# Patient Record
Sex: Male | Born: 1975 | Race: White | Hispanic: No | Marital: Married | Smoking: Never smoker
Health system: Southern US, Community
[De-identification: ages and names within clinical notes are randomized; demographics above are authoritative.]

---

## 1998-10-20 ENCOUNTER — Emergency Department (HOSPITAL_COMMUNITY): Admission: EM | Admit: 1998-10-20 | Discharge: 1998-10-20 | Payer: Self-pay | Admitting: Emergency Medicine

## 1998-10-26 ENCOUNTER — Emergency Department (HOSPITAL_COMMUNITY): Admission: EM | Admit: 1998-10-26 | Discharge: 1998-10-26 | Payer: Self-pay | Admitting: Emergency Medicine

## 1999-04-01 ENCOUNTER — Emergency Department (HOSPITAL_COMMUNITY): Admission: EM | Admit: 1999-04-01 | Discharge: 1999-04-01 | Payer: Self-pay | Admitting: Internal Medicine

## 2000-03-03 ENCOUNTER — Emergency Department (HOSPITAL_COMMUNITY): Admission: EM | Admit: 2000-03-03 | Discharge: 2000-03-04 | Payer: Self-pay | Admitting: Emergency Medicine

## 2001-02-16 ENCOUNTER — Emergency Department (HOSPITAL_COMMUNITY): Admission: EM | Admit: 2001-02-16 | Discharge: 2001-02-16 | Payer: Self-pay

## 2001-02-20 ENCOUNTER — Emergency Department (HOSPITAL_COMMUNITY): Admission: EM | Admit: 2001-02-20 | Discharge: 2001-02-20 | Payer: Self-pay | Admitting: Emergency Medicine

## 2002-01-27 ENCOUNTER — Emergency Department (HOSPITAL_COMMUNITY): Admission: EM | Admit: 2002-01-27 | Discharge: 2002-01-27 | Payer: Self-pay | Admitting: Emergency Medicine

## 2015-09-14 ENCOUNTER — Ambulatory Visit (INDEPENDENT_AMBULATORY_CARE_PROVIDER_SITE_OTHER): Payer: Managed Care, Other (non HMO)

## 2015-09-14 ENCOUNTER — Ambulatory Visit (INDEPENDENT_AMBULATORY_CARE_PROVIDER_SITE_OTHER): Payer: Managed Care, Other (non HMO) | Admitting: Family Medicine

## 2015-09-14 VITALS — BP 130/80 | HR 71 | Temp 97.9°F | Resp 16 | Ht 68.5 in | Wt 171.2 lb

## 2015-09-14 DIAGNOSIS — M439 Deforming dorsopathy, unspecified: Secondary | ICD-10-CM | POA: Diagnosis not present

## 2015-09-14 DIAGNOSIS — M5441 Lumbago with sciatica, right side: Secondary | ICD-10-CM

## 2015-09-14 DIAGNOSIS — R0789 Other chest pain: Secondary | ICD-10-CM

## 2015-09-14 LAB — POCT CBC
Granulocyte percent: 69.9 %G (ref 37–80)
HEMATOCRIT: 44.4 % (ref 43.5–53.7)
HEMOGLOBIN: 15.7 g/dL (ref 14.1–18.1)
LYMPH, POC: 1.7 (ref 0.6–3.4)
MCH: 33 pg — AB (ref 27–31.2)
MCHC: 35.4 g/dL (ref 31.8–35.4)
MCV: 93.3 fL (ref 80–97)
MID (CBC): 0.8 (ref 0–0.9)
MPV: 7 fL (ref 0–99.8)
POC GRANULOCYTE: 5.9 (ref 2–6.9)
POC LYMPH PERCENT: 20.3 %L (ref 10–50)
POC MID %: 9.8 % (ref 0–12)
Platelet Count, POC: 237 10*3/uL (ref 142–424)
RBC: 4.75 M/uL (ref 4.69–6.13)
RDW, POC: 12.4 %
WBC: 8.5 10*3/uL (ref 4.6–10.2)

## 2015-09-14 MED ORDER — MELOXICAM 15 MG PO TABS: 15.0000 mg | ORAL_TABLET | Freq: Every day | ORAL | Status: DC

## 2015-09-14 MED ORDER — METHOCARBAMOL 500 MG PO TABS: 500.0000 mg | ORAL_TABLET | Freq: Four times a day (QID) | ORAL | Status: DC

## 2015-09-14 MED ORDER — TRAMADOL HCL 50 MG PO TABS: 50.0000 mg | ORAL_TABLET | Freq: Four times a day (QID) | ORAL | Status: DC | PRN

## 2015-09-14 NOTE — Progress Notes (Signed)
Subjective:    Patient ID: Dillon Hoffman, male    DOB: 1975-03-26, 40 y.o.   MRN: [id number]  09/14/2015  Back Pain (lower, radiates to right leg) and Shortness of Breath (due to the back pain, x 2 days)   HPI This 40 y.o. male presents for evaluation of chronic lower back pain.  Wanting a referral to a back specialist.  Onset of lower back pain for 1.5 years to 2 years. Suffered with back ache.  Noticed that back has been crooked.  Then underwent evaluation by a chiropractor; noticed a crooked spine.  S/p xrays at chiropractor.  R leg pain; R lower back pain.  Pain radiates from R hip to R ankle. Worsens with laying supine.  Two days ago, developed chest pain and SOB when standing straight up and sitting in car.  Feels like pulled a chest muscle.  Last night slept in chair leaning over table.  Leg pain less severe.  Laying supine makes pain worse.  Taking Tylenol.  No numbness/tingling in arms or legs.  No saddle paresthesias.  No b/b dysfunction.  Chest pain: with SOB; with certain position changes.  Shooting pain along anterior chest.  Leaning over feels better.  Must lean forward in car.  Feels connected to back.  With standing straight up, pain in chest worsens.  Walking or exerting does not cause worsening chest pain.  No cough, congestion.  Was pulling o rope with dog.    PCP: none   Review of Systems  Constitutional: Negative for activity change, appetite change, chills, diaphoresis, fatigue and fever.  Eyes: Negative for visual disturbance.  Respiratory: Negative for cough and shortness of breath.   Cardiovascular: Negative for chest pain, palpitations and leg swelling.  Endocrine: Negative for cold intolerance, heat intolerance, polydipsia, polyphagia and polyuria.  Genitourinary: Negative for decreased urine volume and difficulty urinating.  Musculoskeletal: Positive for back pain and myalgias.  Neurological: Negative for dizziness, tremors, seizures, syncope, facial asymmetry,  speech difficulty, weakness, light-headedness, numbness and headaches.    History reviewed. No pertinent past medical history. History reviewed. No pertinent surgical history. No Known Allergies  Social History   Social History  . Marital status: Married    Spouse name: N/A  . Number of children: N/A  . Years of education: N/A   Occupational History  .      movie [employer]   Social History Main Topics  . Smoking status: Never Smoker  . Smokeless tobacco: Not on file  . Alcohol use Not on file  . Drug use: Unknown  . Sexual activity: Not on file   Other Topics Concern  . Not on file   Social History Narrative  . No narrative on file   Family History  Problem Relation Age of Onset  . Cancer Mother     colon cancer  . Arthritis Mother        Objective:    BP 130/80   Pulse 71   Temp 97.9 F (36.6 C)   Resp 16   Ht 5' 8.5" (1.74 m)   Wt 171 lb 3.2 oz (77.7 kg)   SpO2 96%   BMI 25.65 kg/m  Physical Exam  Constitutional: He is oriented to person, place, and time. He appears well-developed and well-nourished. No distress.  HENT:  Head: Normocephalic and atraumatic.  Right Ear: External ear normal.  Left Ear: External ear normal.  Nose: Nose normal.  Mouth/Throat: Oropharynx is clear and moist.  Eyes: Conjunctivae and  EOM are normal. Pupils are equal, round, and reactive to light.  Neck: Normal range of motion. Neck supple. Carotid bruit is not present. No thyromegaly present.  Cardiovascular: Normal rate, regular rhythm, normal heart sounds and intact distal pulses.  Exam reveals no gallop and no friction rub.   No murmur heard. Pulmonary/Chest: Effort normal and breath sounds normal. He has no wheezes. He has no rales.  Abdominal: Soft. Bowel sounds are normal. He exhibits no distension and no mass. There is no tenderness. There is no rebound and no guarding.  Musculoskeletal:       Cervical back: He exhibits pain and spasm. He exhibits  normal range of motion, no tenderness, no bony tenderness and normal pulse.       Thoracic back: He exhibits pain and spasm. He exhibits normal range of motion, no tenderness and normal pulse.       Lumbar back: He exhibits decreased range of motion, tenderness, pain and spasm. He exhibits no bony tenderness.  Lymphadenopathy:    He has no cervical adenopathy.  Neurological: He is alert and oriented to person, place, and time. No cranial nerve deficit.  Skin: Skin is warm and dry. No rash noted. He is not diaphoretic.  Psychiatric: He has a normal mood and affect. His behavior is normal.  Nursing note and vitals reviewed.  Results for orders placed or performed in visit on 09/14/15  Comprehensive metabolic panel  Result Value Ref Range   Sodium 136 135 - 146 mmol/L   Potassium 3.5 3.5 - 5.3 mmol/L   Chloride 100 98 - 110 mmol/L   CO2 26 20 - 31 mmol/L   Glucose, Bld 85 65 - 99 mg/dL   BUN 12 7 - 25 mg/dL   Creat 4.09 8.11 - 9.14 mg/dL   Total Bilirubin 1.2 0.2 - 1.2 mg/dL   Alkaline Phosphatase 85 40 - 115 U/L   AST 16 10 - 40 U/L   ALT 19 9 - 46 U/L   Total Protein 7.5 6.1 - 8.1 g/dL   Albumin 4.4 3.6 - 5.1 g/dL   Calcium 9.4 8.6 - 78.2 mg/dL  POCT CBC  Result Value Ref Range   WBC 8.5 4.6 - 10.2 K/uL   Lymph, poc 1.7 0.6 - 3.4   POC LYMPH PERCENT 20.3 10 - 50 %L   MID (cbc) 0.8 0 - 0.9   POC MID % 9.8 0 - 12 %M   POC Granulocyte 5.9 2 - 6.9   Granulocyte percent 69.9 37 - 80 %G   RBC 4.75 4.69 - 6.13 M/uL   Hemoglobin 15.7 14.1 - 18.1 g/dL   HCT, POC 95.6 21.3 - 53.7 %   MCV 93.3 80 - 97 fL   MCH, POC 33.0 (A) 27 - 31.2 pg   MCHC 35.4 31.8 - 35.4 g/dL   RDW, POC 08.6 %   Platelet Count, POC 237 142 - 424 K/uL   MPV 7.0 0 - 99.8 fL       Assessment & Plan:   1. Right-sided low back pain with right-sided sciatica   2. Other chest pain   3. Compression deformity of vertebra    -New. -Chest pain likely secondary to radiation from back pain; yet warrants full  evaluation due to lack of regular medical care; obtain CXR, EKG, labs.  No family history of early CAD.  -refer to ortho; rx for Mobic, Robaxin, Tramadol provided; home exercise program provided as well. -compression deformity present at L1-2 that  will warrant MRI/bone scan; will defer to ortho for this work up; will obtain baseline labs to rule out secondary contributing factors.   Orders Placed This Encounter  Procedures  . DG Chest 2 View    Standing Status:   Future    Number of Occurrences:   1    Standing Expiration Date:   09/13/2016    Order Specific Question:   Reason for Exam (SYMPTOM  OR DIAGNOSIS REQUIRED)    Answer:   chronic lower back pain for 2 years; curvature of spine    Order Specific Question:   Preferred imaging location?    Answer:   External  . DG Lumbar Spine Complete    Standing Status:   Future    Number of Occurrences:   1    Standing Expiration Date:   09/13/2016    Order Specific Question:   Reason for Exam (SYMPTOM  OR DIAGNOSIS REQUIRED)    Answer:   chronic lower back pain for 2 years; curvature of spine    Order Specific Question:   Preferred imaging location?    Answer:   External  . Comprehensive metabolic panel  . Ambulatory referral to Orthopedic Surgery    Referral Priority:   Routine    Referral Type:   Surgical    Referral Reason:   Specialty Services Required    Requested Specialty:   Orthopedic Surgery    Number of Visits Requested:   1  . POCT CBC  . EKG 12-Lead   Meds ordered this encounter  Medications  . meloxicam (MOBIC) 15 MG tablet    Sig: Take 1 tablet (15 mg total) by mouth daily.    Dispense:  30 tablet    Refill:  0  . methocarbamol (ROBAXIN) 500 MG tablet    Sig: Take 1 tablet (500 mg total) by mouth 4 (four) times daily.    Dispense:  60 tablet    Refill:  0  . traMADol (ULTRAM) 50 MG tablet    Sig: Take 1 tablet (50 mg total) by mouth every 6 (six) hours as needed.    Dispense:  40 tablet    Refill:  0    Return if  symptoms worsen or fail to improve.    Wilmar Prabhakar Paulita Fujita, M.D. [facility] [address] [place], [place]  [address] [phone] phone [phone] fax

## 2015-09-14 NOTE — Patient Instructions (Addendum)
   IF you received an x-ray today, you will receive an invoice from [facility]. Please contact [facility] at [phone] with questions or concerns regarding your invoice.   IF you received labwork today, you will receive an invoice from [facility]. Please contact [facility] at [phone] with questions or concerns regarding your invoice.   Our billing staff will not be able to assist you with questions regarding bills from these companies.  You will be contacted with the lab results as soon as they are available. The fastest way to get your results is to activate your My Chart account. Instructions are located on the last page of this paperwork. If you have not heard from us regarding the results in 2 weeks, please contact this office.      Sciatica With Rehab The sciatic nerve runs from the back down the leg and is responsible for sensation and control of the muscles in the back (posterior) side of the thigh, lower leg, and foot. Sciatica is a condition that is characterized by inflammation of this nerve.  SYMPTOMS   Signs of nerve damage, including numbness and/or weakness along the posterior side of the lower extremity.  Pain in the back of the thigh that may also travel down the leg.  Pain that worsens when sitting for long periods of time.  Occasionally, pain in the back or buttock. CAUSES  Inflammation of the sciatic nerve is the cause of sciatica. The inflammation is due to something irritating the nerve. Common sources of irritation include:  Sitting for long periods of time.  Direct trauma to the nerve.  Arthritis of the spine.  Herniated or ruptured disk.  Slipping of the vertebrae (spondylolisthesis).  Pressure from soft tissues, such as muscles or ligament-like tissue (fascia). RISK INCREASES WITH:  Sports that place pressure or stress on the spine (football or weightlifting).  Poor strength and  flexibility.  Failure to warm up properly before activity.  Family history of low back pain or disk disorders.  Previous back injury or surgery.  Poor body mechanics, especially when lifting, or poor posture. PREVENTION   Warm up and stretch properly before activity.  Maintain physical fitness:  Strength, flexibility, and endurance.  Cardiovascular fitness.  Learn and use proper technique, especially with posture and lifting. When possible, have coach correct improper technique.  Avoid activities that place stress on the spine. PROGNOSIS If treated properly, then sciatica usually resolves within 6 weeks. However, occasionally surgery is necessary.  RELATED COMPLICATIONS   Permanent nerve damage, including pain, numbness, tingle, or weakness.  Chronic back pain.  Risks of surgery: infection, bleeding, nerve damage, or damage to surrounding tissues. TREATMENT Treatment initially involves resting from any activities that aggravate your symptoms. The use of ice and medication may help reduce pain and inflammation. The use of strengthening and stretching exercises may help reduce pain with activity. These exercises may be performed at home or with referral to a therapist. A therapist may recommend further treatments, such as transcutaneous electronic nerve stimulation (TENS) or ultrasound. Your caregiver may recommend corticosteroid injections to help reduce inflammation of the sciatic nerve. If symptoms persist despite non-surgical (conservative) treatment, then surgery may be recommended. MEDICATION  If pain medication is necessary, then nonsteroidal anti-inflammatory medications, such as aspirin and ibuprofen, or other minor pain relievers, such as acetaminophen, are often recommended.  Do not take pain medication for 7 days before surgery.  Prescription pain relievers may be given if deemed necessary by your   caregiver. Use only as directed and only as much as you  need.  Ointments applied to the skin may be helpful.  Corticosteroid injections may be given by your caregiver. These injections should be reserved for the most serious cases, because they may only be given a certain number of times. HEAT AND COLD  Cold treatment (icing) relieves pain and reduces inflammation. Cold treatment should be applied for 10 to 15 minutes every 2 to 3 hours for inflammation and pain and immediately after any activity that aggravates your symptoms. Use ice packs or massage the area with a piece of ice (ice massage).  Heat treatment may be used prior to performing the stretching and strengthening activities prescribed by your caregiver, physical therapist, or athletic trainer. Use a heat pack or soak the injury in warm water. SEEK MEDICAL CARE IF:  Treatment seems to offer no benefit, or the condition worsens.  Any medications produce adverse side effects. EXERCISES  RANGE OF MOTION (ROM) AND STRETCHING EXERCISES - Sciatica Most people with sciatic will find that their symptoms worsen with either excessive bending forward (flexion) or arching at the low back (extension). The exercises which will help resolve your symptoms will focus on the opposite motion. Your physician, physical therapist or athletic trainer will help you determine which exercises will be most helpful to resolve your low back pain. Do not complete any exercises without first consulting with your clinician. Discontinue any exercises which worsen your symptoms until you speak to your clinician. If you have pain, numbness or tingling which travels down into your buttocks, leg or foot, the goal of the therapy is for these symptoms to move closer to your back and eventually resolve. Occasionally, these leg symptoms will get better, but your low back pain may worsen; this is typically an indication of progress in your rehabilitation. Be certain to be very alert to any changes in your symptoms and the activities in  which you participated in the 24 hours prior to the change. Sharing this information with your clinician will allow him/her to most efficiently treat your condition. These exercises may help you when beginning to rehabilitate your injury. Your symptoms may resolve with or without further involvement from your physician, physical therapist or athletic trainer. While completing these exercises, remember:   Restoring tissue flexibility helps normal motion to return to the joints. This allows healthier, less painful movement and activity.  An effective stretch should be held for at least 30 seconds.  A stretch should never be painful. You should only feel a gentle lengthening or release in the stretched tissue. FLEXION RANGE OF MOTION AND STRETCHING EXERCISES: STRETCH - Flexion, Single Knee to Chest   Lie on a firm bed or floor with both legs extended in front of you.  Keeping one leg in contact with the floor, bring your opposite knee to your chest. Hold your leg in place by either grabbing behind your thigh or at your knee.  Pull until you feel a gentle stretch in your low back. Hold __________ seconds.  Slowly release your grasp and repeat the exercise with the opposite side. Repeat __________ times. Complete this exercise __________ times per day.  STRETCH - Flexion, Double Knee to Chest  Lie on a firm bed or floor with both legs extended in front of you.  Keeping one leg in contact with the floor, bring your opposite knee to your chest.  Tense your stomach muscles to support your back and then lift your other knee to   your chest. Hold your legs in place by either grabbing behind your thighs or at your knees.  Pull both knees toward your chest until you feel a gentle stretch in your low back. Hold __________ seconds.  Tense your stomach muscles and slowly return one leg at a time to the floor. Repeat __________ times. Complete this exercise __________ times per day.  STRETCH - Low Trunk  Rotation   Lie on a firm bed or floor. Keeping your legs in front of you, bend your knees so they are both pointed toward the ceiling and your feet are flat on the floor.  Extend your arms out to the side. This will stabilize your upper body by keeping your shoulders in contact with the floor.  Gently and slowly drop both knees together to one side until you feel a gentle stretch in your low back. Hold for __________ seconds.  Tense your stomach muscles to support your low back as you bring your knees back to the starting position. Repeat the exercise to the other side. Repeat __________ times. Complete this exercise __________ times per day  EXTENSION RANGE OF MOTION AND FLEXIBILITY EXERCISES: STRETCH - Extension, Prone on Elbows  Lie on your stomach on the floor, a bed will be too soft. Place your palms about shoulder width apart and at the height of your head.  Place your elbows under your shoulders. If this is too painful, stack pillows under your chest.  Allow your body to relax so that your hips drop lower and make contact more completely with the floor.  Hold this position for __________ seconds.  Slowly return to lying flat on the floor. Repeat __________ times. Complete this exercise __________ times per day.  RANGE OF MOTION - Extension, Prone Press Ups  Lie on your stomach on the floor, a bed will be too soft. Place your palms about shoulder width apart and at the height of your head.  Keeping your back as relaxed as possible, slowly straighten your elbows while keeping your hips on the floor. You may adjust the placement of your hands to maximize your comfort. As you gain motion, your hands will come more underneath your shoulders.  Hold this position __________ seconds.  Slowly return to lying flat on the floor. Repeat __________ times. Complete this exercise __________ times per day.  STRENGTHENING EXERCISES - Sciatica  These exercises may help you when beginning to  rehabilitate your injury. These exercises should be done near your "sweet spot." This is the neutral, low-back arch, somewhere between fully rounded and fully arched, that is your least painful position. When performed in this safe range of motion, these exercises can be used for people who have either a flexion or extension based injury. These exercises may resolve your symptoms with or without further involvement from your physician, physical therapist or athletic trainer. While completing these exercises, remember:   Muscles can gain both the endurance and the strength needed for everyday activities through controlled exercises.  Complete these exercises as instructed by your physician, physical therapist or athletic trainer. Progress with the resistance and repetition exercises only as your caregiver advises.  You may experience muscle soreness or fatigue, but the pain or discomfort you are trying to eliminate should never worsen during these exercises. If this pain does worsen, stop and make certain you are following the directions exactly. If the pain is still present after adjustments, discontinue the exercise until you can discuss the trouble with your clinician. STRENGTHENING - Deep   Abdominals, Pelvic Tilt   Lie on a firm bed or floor. Keeping your legs in front of you, bend your knees so they are both pointed toward the ceiling and your feet are flat on the floor.  Tense your lower abdominal muscles to press your low back into the floor. This motion will rotate your pelvis so that your tail bone is scooping upwards rather than pointing at your feet or into the floor.  With a gentle tension and even breathing, hold this position for __________ seconds. Repeat __________ times. Complete this exercise __________ times per day.  STRENGTHENING - Abdominals, Crunches   Lie on a firm bed or floor. Keeping your legs in front of you, bend your knees so they are both pointed toward the ceiling and  your feet are flat on the floor. Cross your arms over your chest.  Slightly tip your chin down without bending your neck.  Tense your abdominals and slowly lift your trunk high enough to just clear your shoulder blades. Lifting higher can put excessive stress on the low back and does not further strengthen your abdominal muscles.  Control your return to the starting position. Repeat __________ times. Complete this exercise __________ times per day.  STRENGTHENING - Quadruped, Opposite UE/LE Lift  Assume a hands and knees position on a firm surface. Keep your hands under your shoulders and your knees under your hips. You may place padding under your knees for comfort.  Find your neutral spine and gently tense your abdominal muscles so that you can maintain this position. Your shoulders and hips should form a rectangle that is parallel with the floor and is not twisted.  Keeping your trunk steady, lift your right hand no higher than your shoulder and then your left leg no higher than your hip. Make sure you are not holding your breath. Hold this position __________ seconds.  Continuing to keep your abdominal muscles tense and your back steady, slowly return to your starting position. Repeat with the opposite arm and leg. Repeat __________ times. Complete this exercise __________ times per day.  STRENGTHENING - Abdominals and Quadriceps, Straight Leg Raise   Lie on a firm bed or floor with both legs extended in front of you.  Keeping one leg in contact with the floor, bend the other knee so that your foot can rest flat on the floor.  Find your neutral spine, and tense your abdominal muscles to maintain your spinal position throughout the exercise.  Slowly lift your straight leg off the floor about 6 inches for a count of 15, making sure to not hold your breath.  Still keeping your neutral spine, slowly lower your leg all the way to the floor. Repeat this exercise with each leg __________  times. Complete this exercise __________ times per day. POSTURE AND BODY MECHANICS CONSIDERATIONS - Sciatica Keeping correct posture when sitting, standing or completing your activities will reduce the stress put on different body tissues, allowing injured tissues a chance to heal and limiting painful experiences. The following are general guidelines for improved posture. Your physician or physical therapist will provide you with any instructions specific to your needs. While reading these guidelines, remember:  The exercises prescribed by your provider will help you have the flexibility and strength to maintain correct postures.  The correct posture provides the optimal environment for your joints to work. All of your joints have less wear and tear when properly supported by a spine with good posture. This means you will experience   a healthier, less painful body.  Correct posture must be practiced with all of your activities, especially prolonged sitting and standing. Correct posture is as important when doing repetitive low-stress activities (typing) as it is when doing a single heavy-load activity (lifting). RESTING POSITIONS Consider which positions are most painful for you when choosing a resting position. If you have pain with flexion-based activities (sitting, bending, stooping, squatting), choose a position that allows you to rest in a less flexed posture. You would want to avoid curling into a fetal position on your side. If your pain worsens with extension-based activities (prolonged standing, working overhead), avoid resting in an extended position such as sleeping on your stomach. Most people will find more comfort when they rest with their spine in a more neutral position, neither too rounded nor too arched. Lying on a non-sagging bed on your side with a pillow between your knees, or on your back with a pillow under your knees will often provide some relief. Keep in mind, being in any one  position for a prolonged period of time, no matter how correct your posture, can still lead to stiffness. PROPER SITTING POSTURE In order to minimize stress and discomfort on your spine, you must sit with correct posture Sitting with good posture should be effortless for a healthy body. Returning to good posture is a gradual process. Many people can work toward this most comfortably by using various supports until they have the flexibility and strength to maintain this posture on their own. When sitting with proper posture, your ears will fall over your shoulders and your shoulders will fall over your hips. You should use the back of the chair to support your upper back. Your low back will be in a neutral position, just slightly arched. You may place a small pillow or folded towel at the base of your low back for support.  When working at a desk, create an environment that supports good, upright posture. Without extra support, muscles fatigue and lead to excessive strain on joints and other tissues. Keep these recommendations in mind: CHAIR:   A chair should be able to slide under your desk when your back makes contact with the back of the chair. This allows you to work closely.  The chair's height should allow your eyes to be level with the upper part of your monitor and your hands to be slightly lower than your elbows. BODY POSITION  Your feet should make contact with the floor. If this is not possible, use a foot rest.  Keep your ears over your shoulders. This will reduce stress on your neck and low back. INCORRECT SITTING POSTURES   If you are feeling tired and unable to assume a healthy sitting posture, do not slouch or slump. This puts excessive strain on your back tissues, causing more damage and pain. Healthier options include:  Using more support, like a lumbar pillow.  Switching tasks to something that requires you to be upright or walking.  Talking a brief walk.  Lying down to  rest in a neutral-spine position. PROLONGED STANDING WHILE SLIGHTLY LEANING FORWARD  When completing a task that requires you to lean forward while standing in one place for a long time, place either foot up on a stationary 2-4 inch high object to help maintain the best posture. When both feet are on the ground, the low back tends to lose its slight inward curve. If this curve flattens (or becomes too large), then the back and your other   joints will experience too much stress, fatigue more quickly and can cause pain.  CORRECT STANDING POSTURES Proper standing posture should be assumed with all daily activities, even if they only take a few moments, like when brushing your teeth. As in sitting, your ears should fall over your shoulders and your shoulders should fall over your hips. You should keep a slight tension in your abdominal muscles to brace your spine. Your tailbone should point down to the ground, not behind your body, resulting in an over-extended swayback posture.  INCORRECT STANDING POSTURES  Common incorrect standing postures include a forward head, locked knees and/or an excessive swayback. WALKING Walk with an upright posture. Your ears, shoulders and hips should all line-up. PROLONGED ACTIVITY IN A FLEXED POSITION When completing a task that requires you to bend forward at your waist or lean over a low surface, try to find a way to stabilize 3 of 4 of your limbs. You can place a hand or elbow on your thigh or rest a knee on the surface you are reaching across. This will provide you more stability so that your muscles do not fatigue as quickly. By keeping your knees relaxed, or slightly bent, you will also reduce stress across your low back. CORRECT LIFTING TECHNIQUES DO :   Assume a wide stance. This will provide you more stability and the opportunity to get as close as possible to the object which you are lifting.  Tense your abdominals to brace your spine; then bend at the knees and  hips. Keeping your back locked in a neutral-spine position, lift using your leg muscles. Lift with your legs, keeping your back straight.  Test the weight of unknown objects before attempting to lift them.  Try to keep your elbows locked down at your sides in order get the best strength from your shoulders when carrying an object.  Always ask for help when lifting heavy or awkward objects. INCORRECT LIFTING TECHNIQUES DO NOT:   Lock your knees when lifting, even if it is a small object.  Bend and twist. Pivot at your feet or move your feet when needing to change directions.  Assume that you cannot safely pick up a paperclip without proper posture.   This information is not intended to replace advice given to you by your health care provider. Make sure you discuss any questions you have with your health care provider.   Document Released: 02/19/2005 Document Revised: 07/06/2014 Document Reviewed: 06/03/2008 [employer] Interactive Patient Education 2016 [employer]  

## 2015-09-15 LAB — COMPREHENSIVE METABOLIC PANEL
ALK PHOS: 85 U/L (ref 40–115)
ALT: 19 U/L (ref 9–46)
AST: 16 U/L (ref 10–40)
Albumin: 4.4 g/dL (ref 3.6–5.1)
BUN: 12 mg/dL (ref 7–25)
CALCIUM: 9.4 mg/dL (ref 8.6–10.3)
CO2: 26 mmol/L (ref 20–31)
Chloride: 100 mmol/L (ref 98–110)
Creat: 1.11 mg/dL (ref 0.60–1.35)
GLUCOSE: 85 mg/dL (ref 65–99)
POTASSIUM: 3.5 mmol/L (ref 3.5–5.3)
Sodium: 136 mmol/L (ref 135–146)
Total Bilirubin: 1.2 mg/dL (ref 0.2–1.2)
Total Protein: 7.5 g/dL (ref 6.1–8.1)

## 2015-10-17 ENCOUNTER — Other Ambulatory Visit: Payer: Self-pay | Admitting: Family Medicine

## 2015-11-10 ENCOUNTER — Encounter: Payer: Self-pay | Admitting: Family Medicine

## 2015-12-06 ENCOUNTER — Institutional Professional Consult (permissible substitution) (INDEPENDENT_AMBULATORY_CARE_PROVIDER_SITE_OTHER): Payer: Managed Care, Other (non HMO) | Admitting: Physical Medicine and Rehabilitation

## 2015-12-06 DIAGNOSIS — M47816 Spondylosis without myelopathy or radiculopathy, lumbar region: Secondary | ICD-10-CM

## 2015-12-06 DIAGNOSIS — M5416 Radiculopathy, lumbar region: Secondary | ICD-10-CM

## 2016-01-03 ENCOUNTER — Encounter (INDEPENDENT_AMBULATORY_CARE_PROVIDER_SITE_OTHER): Payer: Self-pay | Admitting: Physical Medicine and Rehabilitation

## 2016-01-03 ENCOUNTER — Ambulatory Visit (INDEPENDENT_AMBULATORY_CARE_PROVIDER_SITE_OTHER): Payer: Managed Care, Other (non HMO) | Admitting: Physical Medicine and Rehabilitation

## 2016-01-03 VITALS — BP 124/84 | HR 64

## 2016-01-03 DIAGNOSIS — S39012D Strain of muscle, fascia and tendon of lower back, subsequent encounter: Secondary | ICD-10-CM

## 2016-01-03 DIAGNOSIS — G8929 Other chronic pain: Secondary | ICD-10-CM

## 2016-01-03 DIAGNOSIS — M5441 Lumbago with sciatica, right side: Secondary | ICD-10-CM

## 2016-01-03 NOTE — Progress Notes (Signed)
Office Visit Note   Patient: Dillon Hoffman           Date of Birth: November 26, 1975           MRN: [id number] Visit Date: 01/03/2016              Requested by: No referring provider defined for this encounter. PCP: No primary care provider on file.   Assessment & Plan: Visit Diagnoses:  1. Chronic right-sided low back pain with right-sided sciatica   2. Strain of lumbar region, subsequent encounter     Plan: Chronic history of axial low back pain with right-sided radicular-type pain in the hip and thigh. He has done well on meloxicam now for 1 month. He has had trouble getting in the physical therapy due to work schedule but he is planning to do that next week. Overall he has done much better he still has continued pain note on that side but he is able to work and function at this point is very happy with the meloxicam. I do want him to go through the course of physical therapy at least short-term to get a look at possible trigger points and/or corrective exercises and evaluation for his posture and core strengthening. In the future if he is just doing well with the meloxicam and affect have a plan as far as using that chronically. Also if he has worsening or just stagnates in terms of his response that we would look at an MRI of the lumbar spine.  Follow-Up Instructions: Return if symptoms worsen or fail to improve, for Recheck spine.   Orders:  No orders of the defined types were placed in this encounter.  No orders of the defined types were placed in this encounter.     Procedures: No procedures performed   Clinical Data: No additional findings.   Subjective: Chief Complaint  Patient presents with  . Lower Back - Pain    Back Pain  Pertinent negatives include no abdominal pain, chest pain, fever or headaches.   Has been taking Meloxicam. Still having some right leg pain, but reports he is doing much better. He is sleeping better. He did not get a chance to complete physical  therapy. We are waiting to see Dr[facility] Cly[facility] for core strengthening and evaluation of possible trigger point treatment myofascial pain. He is having right-sided radicular type pain and is chronic. He has done well with the meloxicam. He's had no focal weakness or bowel or bladder changes. He has been more mobile with the meloxicam. We long discussion today about nonsteroidal anti-inflammatories. He does not have any comorbidities for being a problem taking this. We will counsel him in the future if this becomes more chronic situation. He does get some relief with rest he has most of his pain with activity and at night. He is sleeping better.  Review of Systems  Constitutional: Negative for chills, fatigue, fever and unexpected weight change.  HENT: Negative for sore throat and trouble swallowing.   Eyes: Negative for photophobia and visual disturbance.  Respiratory: Negative for chest tightness and shortness of breath.   Cardiovascular: Negative for chest pain.  Gastrointestinal: Negative for abdominal pain.  Endocrine: Negative for cold intolerance and heat intolerance.  Musculoskeletal: Positive for back pain. Negative for myalgias.  Skin: Negative for color change and rash.  Neurological: Negative for speech difficulty and headaches.  Psychiatric/Behavioral: Negative for confusion. The patient is not nervous/anxious.      Objective: Vital  Signs: BP 124/84   Pulse 64   Physical Exam  Constitutional: He appears well-developed and well-nourished. No distress.  Eyes: Conjunctivae are normal. Pupils are equal, round, and reactive to light.  Cardiovascular: Regular rhythm and intact distal pulses.   Pulmonary/Chest: Effort normal and breath sounds normal.  Skin: Skin is warm.  Psychiatric: He has a normal mood and affect.   The patient ambulates without aid with a normal gait. He does appear to ambulate with a straight her spine than he did when I saw him last on his  he had more of a listing to the right. He has no pain with hip rotation. Has good strength distally without deficits. He is no clonus bilaterally. He does have tight hamstrings bilaterally. Ortho Exam  Specialty Comments:  No specialty comments available.  Imaging: No results found.   PMFS History: There are no active problems to display for this patient.  History reviewed. No pertinent past medical history.  Family History  Problem Relation Age of Onset  . Cancer Mother     colon cancer  . Arthritis Mother     History reviewed. No pertinent surgical history. Social History   Occupational History  .      [employer]   Social History Main Topics  . Smoking status: Never Smoker  . Smokeless tobacco: Never Used  . Alcohol use Not on file  . Drug use: Unknown  . Sexual activity: Not on file

## 2016-03-07 ENCOUNTER — Other Ambulatory Visit (INDEPENDENT_AMBULATORY_CARE_PROVIDER_SITE_OTHER): Payer: Self-pay | Admitting: Physical Medicine and Rehabilitation

## 2016-03-07 MED ORDER — MELOXICAM 15 MG PO TABS: 15.0000 mg | ORAL_TABLET | Freq: Every day | ORAL | 0 refills | Status: DC

## 2016-03-07 NOTE — Telephone Encounter (Signed)
Prescription faxed and patient notified.

## 2016-03-07 NOTE — Telephone Encounter (Signed)
Patient requesting refill on Meloxicam. Has only 2 left.  Please call in @ [facility] [address] [place]

## 2016-04-04 ENCOUNTER — Encounter (INDEPENDENT_AMBULATORY_CARE_PROVIDER_SITE_OTHER): Payer: Self-pay | Admitting: Physical Medicine and Rehabilitation

## 2016-04-04 ENCOUNTER — Ambulatory Visit (INDEPENDENT_AMBULATORY_CARE_PROVIDER_SITE_OTHER): Payer: Managed Care, Other (non HMO) | Admitting: Physical Medicine and Rehabilitation

## 2016-04-04 VITALS — BP 138/94 | HR 63

## 2016-04-04 DIAGNOSIS — G8929 Other chronic pain: Secondary | ICD-10-CM | POA: Diagnosis not present

## 2016-04-04 DIAGNOSIS — M5416 Radiculopathy, lumbar region: Secondary | ICD-10-CM | POA: Diagnosis not present

## 2016-04-04 DIAGNOSIS — M5441 Lumbago with sciatica, right side: Secondary | ICD-10-CM

## 2016-04-04 MED ORDER — PREDNISONE 50 MG PO TABS: ORAL_TABLET | ORAL | 0 refills | Status: DC

## 2016-04-04 MED ORDER — GABAPENTIN 300 MG PO CAPS: 300.0000 mg | ORAL_CAPSULE | Freq: Every day | ORAL | 1 refills | Status: DC

## 2016-04-04 NOTE — Progress Notes (Signed)
Dillon Hoffman - 41 y.o. male MRN [id number]  Date of birth: 12-14-1975  Office Visit Note: Visit Date: 04/04/2016 PCP: No PCP Per Patient Referred by: No ref. provider found  Subjective: Chief Complaint  Patient presents with  . Right Leg - Pain   HPI: Dillon Hoffman is a 41 year old gentleman with history of several months of right radicular leg pain and back pain. When we saw him it was initially more of a sprain strain type of activity with pain with prolonged sitting and movement. We treated this with anti-inflammatories and physical therapy which she did well with. Now he comes in stating that 2 days ago he woke up with pain and numbness and tingling below the knee and L5 dermatome to the top of the foot. He states that the pain is the worst it has been. He is actually doing a little bit better today than when it first started. He has not had a going down the leg quite as far. He feels like he is having weakness in the foot. He does not note any new trauma. He does not note any left-sided complaints. He does feel a numbness that fairly dense with a lot of pain. He is continuing with the meloxicam but not to the being of much help at this point. He has not had an MRI of the lumbar spine.    Has been doing well with Meloxicam. Woke up yesterday morning with pain from below his right knee to his foot with numbness in the foot. He said the pain was the worst it has been. Doing a little better today. Review of Systems  Constitutional: Negative for chills, fever, malaise/fatigue and weight loss.  HENT: Negative for hearing loss and sinus pain.   Eyes: Negative for blurred vision, double vision and photophobia.  Respiratory: Negative for cough and shortness of breath.   Cardiovascular: Negative for chest pain, palpitations and leg swelling.  Gastrointestinal: Negative for abdominal pain, nausea and vomiting.  Genitourinary: Negative for flank pain.  Musculoskeletal: Positive for back pain. Negative  for myalgias.  Skin: Negative for itching and rash.  Neurological: Positive for tingling, focal weakness and weakness. Negative for tremors.  Endo/Heme/Allergies: Negative.   Psychiatric/Behavioral: Negative for depression.  All other systems reviewed and are negative.  Otherwise per HPI.  Assessment & Plan: Visit Diagnoses:  1. Chronic right-sided low back pain with right-sided sciatica   2. Lumbar radiculopathy     Plan: Findings:  Worsening right low back and now right hip and leg pain with numbness tingling paresthesia and weakness in an L5 dermatome. He is not having progressive weakness but he clearly has weakness on dorsiflexion and EHL on the right. This seems to be likely related to disc herniation probably at L4-5 or potentially laterally at L5-S1. He is having a lot more pain than he had when I saw him most recently. He's been through physical therapy continues with exercises and was doing well with the meloxicam up until this. I think at this point the best approach is 50 mg of prednisone daily for 5 days as well as starting gabapentin at night. These were all reviewed with him. This is for pain control and to get him through over the next several days to get an MRI of the lumbar spine. I think with the weakness at this point we do need to get an MRI of the lumbar spine to evaluate the next step. Hopefully this does not show severe compression and we could  look at epidural injection. We still discussed non-surgical and conservative approaches 2 disc herniations and thus what is. The natural history of the this will do better in the strength will likely come back if it's not something severe. See him back after the MRI potentially look at epidural injection same day if that still gives him a lot of grief. If his symptoms worsen I would look at a short-term course of an opioid medication. I spent more than 25 minutes speaking face-to-face with the patient with 50% of the time in counseling.     Meds & Orders:  Meds ordered this encounter  Medications  . predniSONE (DELTASONE) 50 MG tablet    Sig: Take 1 tablet daily with food for 5 days until finished    Dispense:  5 tablet    Refill:  0  . gabapentin (NEURONTIN) 300 MG capsule    Sig: Take 1 capsule (300 mg total) by mouth at bedtime.    Dispense:  90 capsule    Refill:  1    Orders Placed This Encounter  Procedures  . MR LUMBAR SPINE WO CONTRAST    Follow-up: Return for MRI review after completion.   Procedures: No procedures performed  No notes on file   Clinical History: No specialty comments available.  He reports that he has never smoked. He has never used smokeless tobacco. No results for input(s): HGBA1C, LABURIC in the last 8760 hours.  Objective:  VS:  HT:    WT:   BMI:     BP:(!) 138/94  HR:63bpm  TEMP: ( )  RESP:  Physical Exam  Constitutional: He is oriented to person, place, and time. He appears well-developed and well-nourished. No distress.  HENT:  Head: Normocephalic and atraumatic.  Nose: Nose normal.  Mouth/Throat: Oropharynx is clear and moist.  Eyes: Conjunctivae are normal. Pupils are equal, round, and reactive to light.  Neck: Normal range of motion. Neck supple.  Cardiovascular: Regular rhythm and intact distal pulses.   Pulmonary/Chest: Effort normal and breath sounds normal.  Abdominal: Soft. He exhibits no distension.  Musculoskeletal: He exhibits no deformity.  Patient sits very uncomfortably. He would rather stand and sit. He has a positive slump test on the right with no clonus. He has weakness of EHL and dorsiflexion on the right. He has tingling impaired sensation in an L5 dermatome on the right.  Neurological: He is alert and oriented to person, place, and time. He exhibits normal muscle tone. Coordination normal.  Skin: Skin is warm. No rash noted.  Psychiatric: He has a normal mood and affect. His behavior is normal.  Nursing note and vitals reviewed.   Ortho  Exam Imaging: No results found.  Past Medical/Family/Surgical/Social History: Medications & Allergies reviewed per EMR There are no active problems to display for this patient.  History reviewed. No pertinent past medical history. Family History  Problem Relation Age of Onset  . Cancer Mother     colon cancer  . Arthritis Mother    History reviewed. No pertinent surgical history. Social History   Occupational History  .      [employer]   Social History Main Topics  . Smoking status: Never Smoker  . Smokeless tobacco: Never Used  . Alcohol use Not on file  . Drug use: Unknown  . Sexual activity: Not on file

## 2016-11-29 IMAGING — DX DG LUMBAR SPINE COMPLETE 4+V
5 series · 5 of 5 positions shown · non-contrast
Comparison: None.

CLINICAL DATA: 39-year-old male is with right-sided sciatica and
low back pain.

EXAM:
LUMBAR SPINE - COMPLETE 4+ VIEW

[l-spine ap]
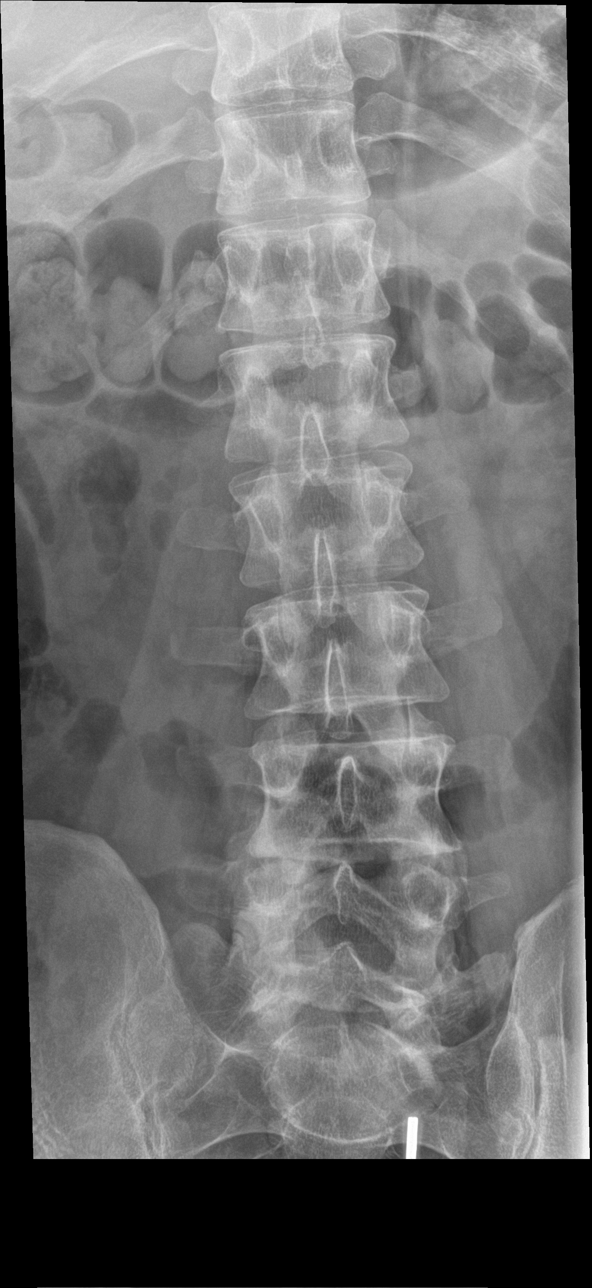

[l-spine obl (1 of 2)]
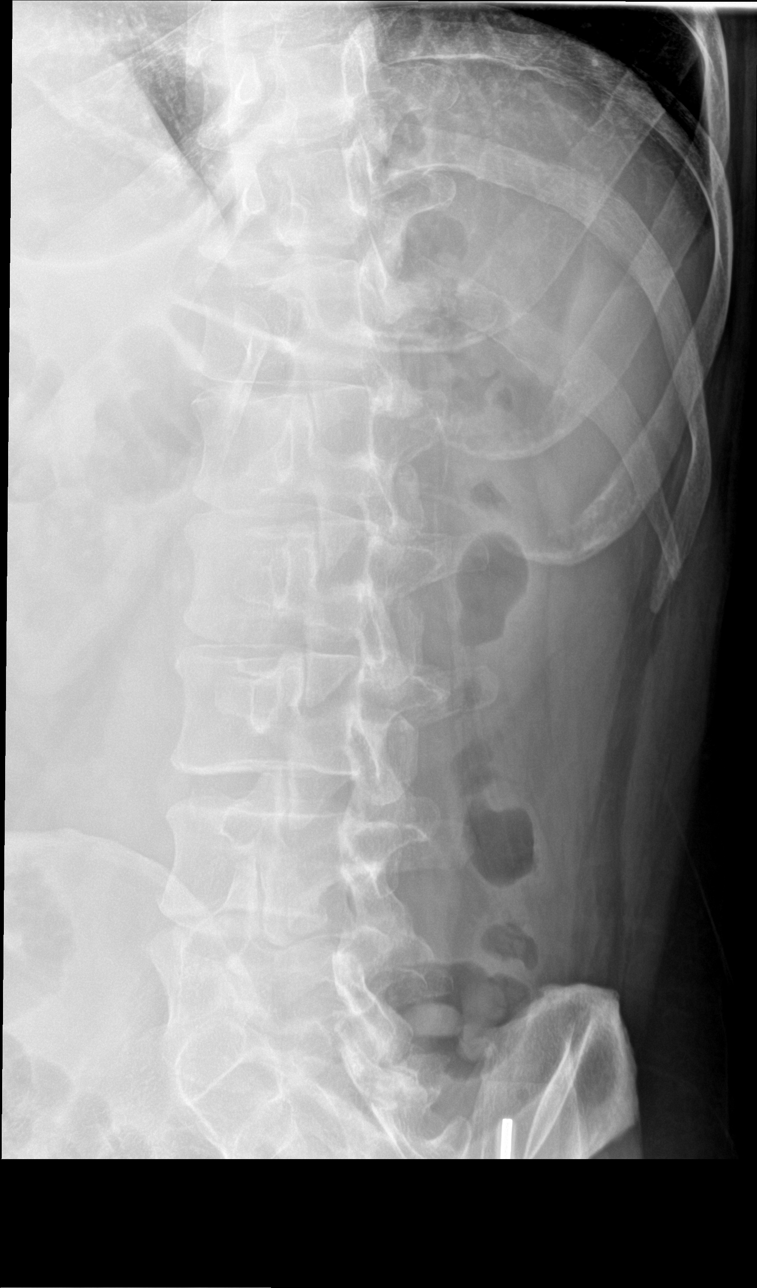

[l-spine obl (2 of 2)]
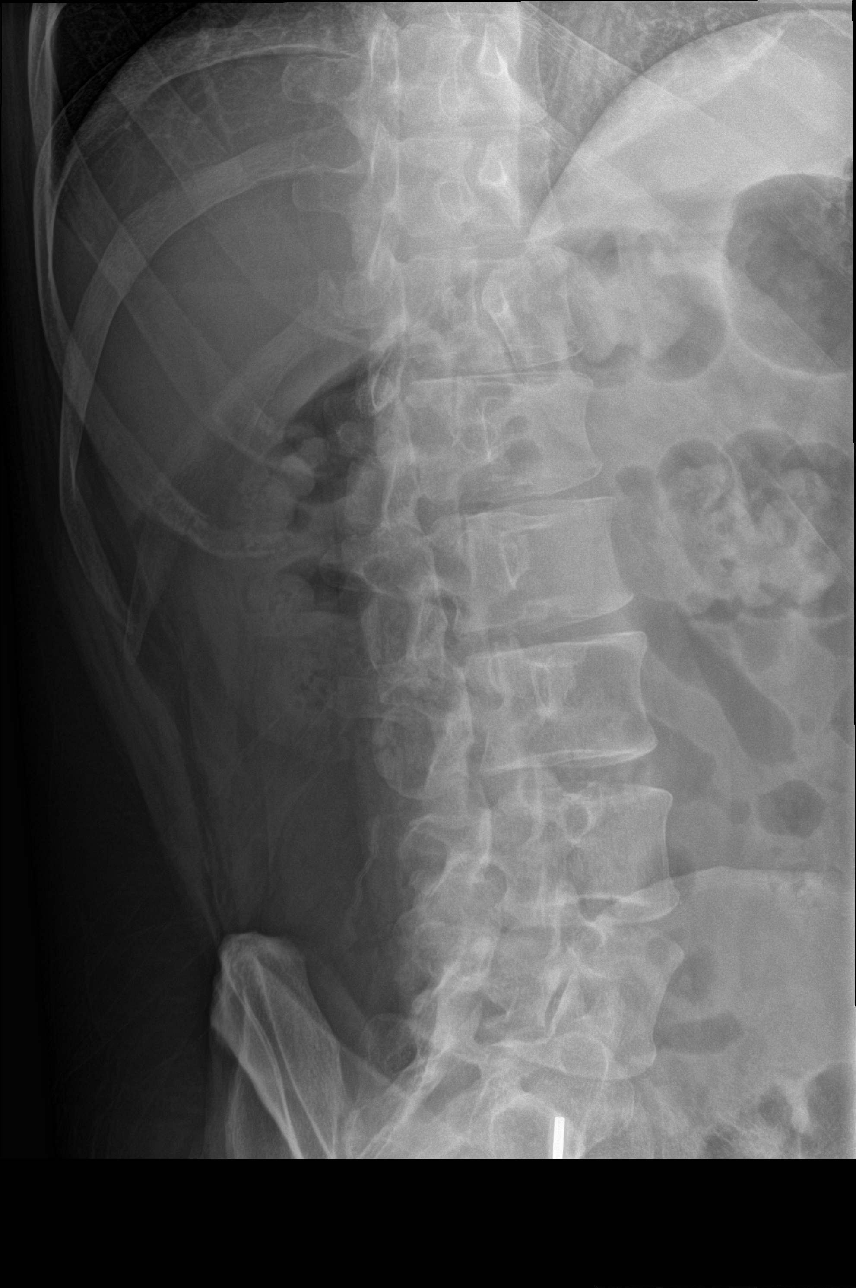

[l-spine lat]
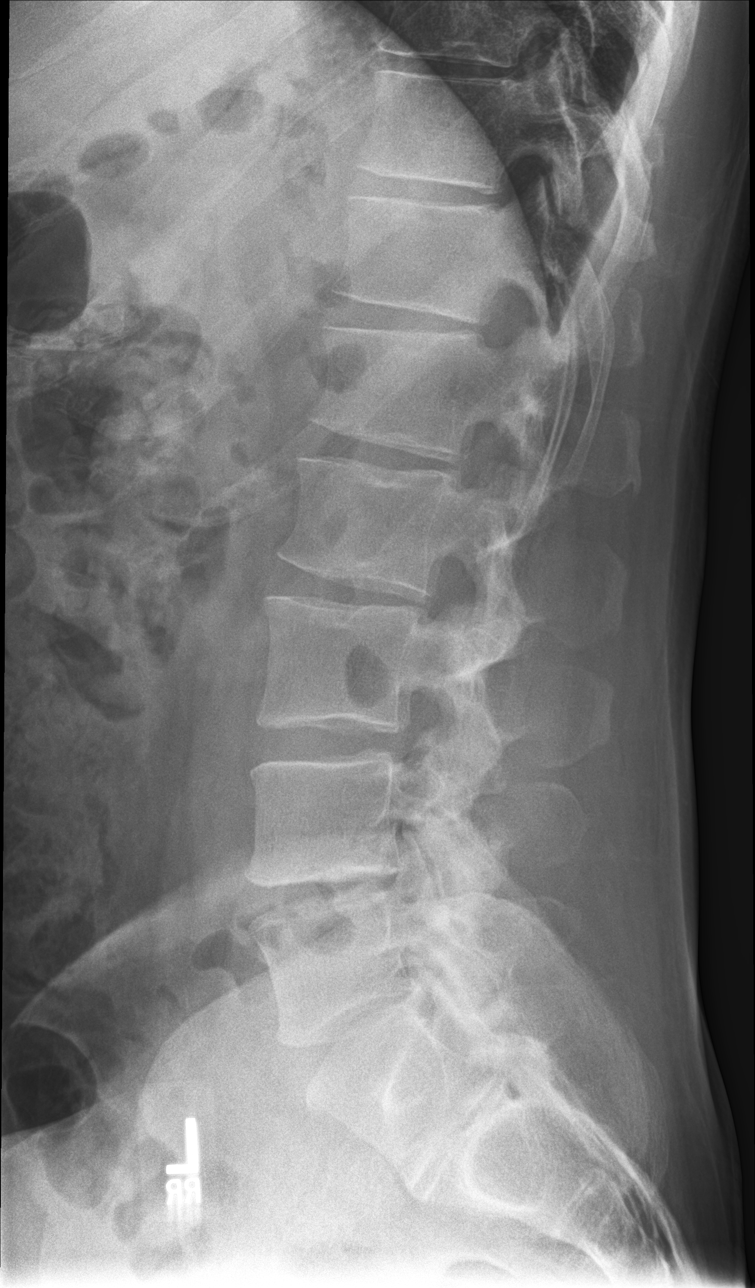

[l-spine l5-s1]
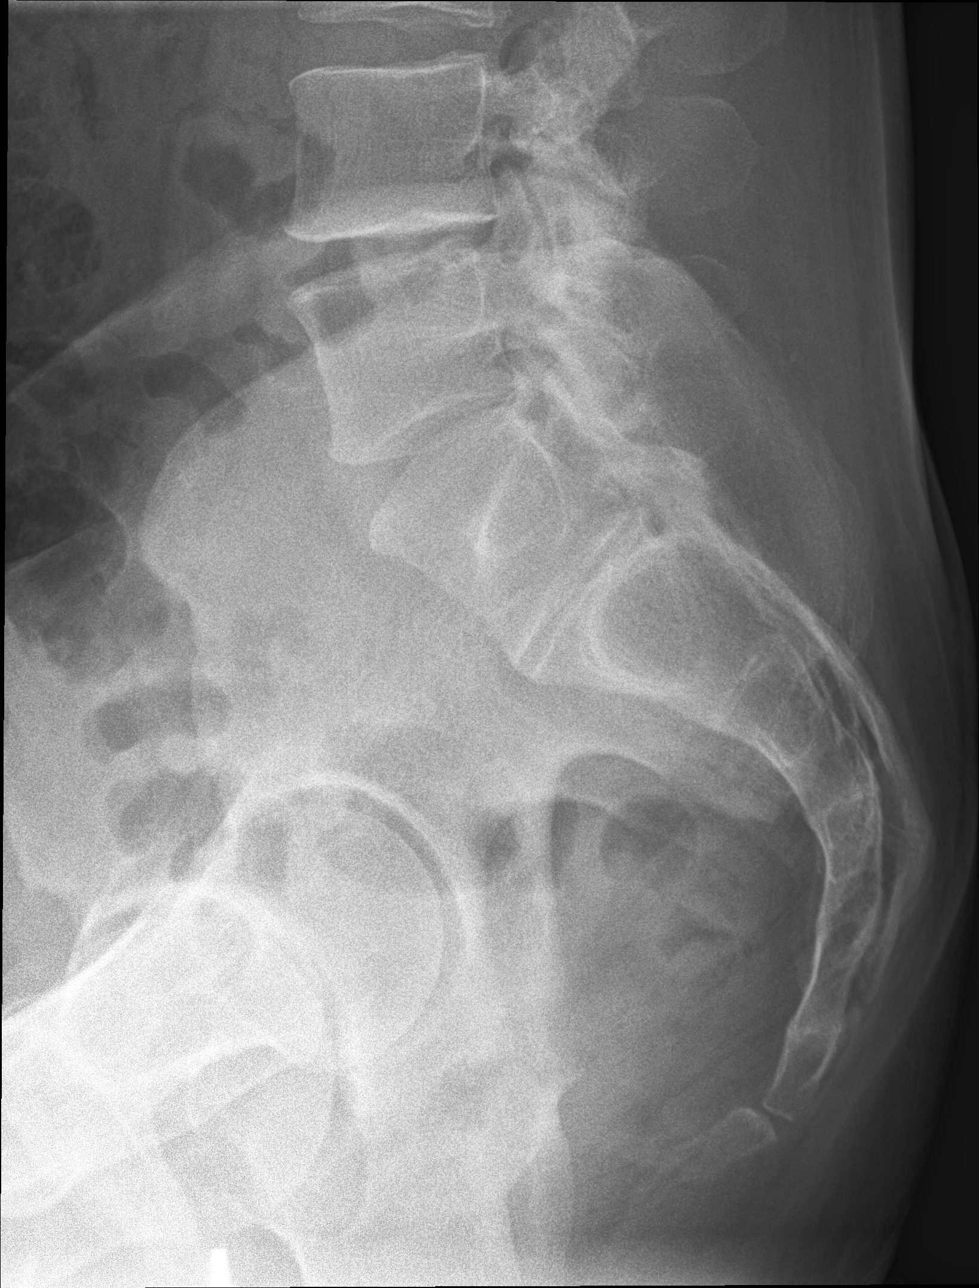

[5 of 5 positions shown; findings below may reference images not displayed]

FINDINGS: There is no acute fracture or subluxation of the lumbar spine. There
is minimal chronic appearing compression deformity of the all L1 and
L2 with anterior wedging. The visualized transverse and spinous
processes appear intact. The soft tissues are grossly unremarkable.
IMPRESSION: No acute/traumatic lumbar spine pathology.

Minimal old-appearing upper lumbar compression deformity

## 2016-12-25 ENCOUNTER — Other Ambulatory Visit (INDEPENDENT_AMBULATORY_CARE_PROVIDER_SITE_OTHER): Payer: Self-pay | Admitting: Physical Medicine and Rehabilitation

## 2016-12-27 NOTE — Telephone Encounter (Signed)
Please advise 

## 2017-07-25 ENCOUNTER — Encounter: Payer: Self-pay | Admitting: Family Medicine

## 2017-08-12 ENCOUNTER — Other Ambulatory Visit (INDEPENDENT_AMBULATORY_CARE_PROVIDER_SITE_OTHER): Payer: Self-pay | Admitting: Physical Medicine and Rehabilitation

## 2017-08-12 NOTE — Telephone Encounter (Signed)
Please advise 

## 2017-09-22 ENCOUNTER — Ambulatory Visit (HOSPITAL_COMMUNITY)
Admission: EM | Admit: 2017-09-22 | Discharge: 2017-09-22 | Disposition: A | Discharge status: Home / Self Care | Payer: Managed Care, Other (non HMO) | Attending: Family Medicine | Admitting: Family Medicine

## 2017-09-22 ENCOUNTER — Encounter (HOSPITAL_COMMUNITY): Payer: Self-pay | Admitting: Emergency Medicine

## 2017-09-22 DIAGNOSIS — M545 Low back pain, unspecified: Secondary | ICD-10-CM

## 2017-09-22 MED ORDER — CYCLOBENZAPRINE HCL 5 MG PO TABS: 5.0000 mg | ORAL_TABLET | Freq: Two times a day (BID) | ORAL | 0 refills | Status: AC | PRN

## 2017-09-22 MED ORDER — KETOROLAC TROMETHAMINE 30 MG/ML IJ SOLN
INTRAMUSCULAR | Status: AC
  Filled 2017-09-22: qty 1

## 2017-09-22 MED ORDER — KETOROLAC TROMETHAMINE 30 MG/ML IJ SOLN
30.0000 mg | Freq: Once | INTRAMUSCULAR | Status: AC
  Administered 2017-09-22: 30 mg via INTRAMUSCULAR

## 2017-09-22 MED ORDER — MELOXICAM 15 MG PO TABS: 15.0000 mg | ORAL_TABLET | Freq: Every day | ORAL | 0 refills | Status: AC

## 2017-09-22 NOTE — Discharge Instructions (Signed)
Toradol injection in office today. Start mobic as directed. Flexeril as needed at night. Flexeril can make you drowsy, so do not take if you are going to drive, operate heavy machinery, or make important decisions. Ice/heat compresses as needed. This can take up to 3-4 weeks to completely resolve, but you should be feeling better each week. Follow up here or with PCP if symptoms worsen, changes for reevaluation. If experience numbness/tingling of the inner thighs, loss of bladder or bowel control, go to the emergency department for evaluation.

## 2017-09-22 NOTE — ED Provider Notes (Signed)
[facility]    CSN: [id number] Arrival date & time: 09/22/17  1508     History   Chief Complaint Chief Complaint  Patient presents with  . Back Pain    HPI Dillon Hoffman is a 42 y.o. male.   42 year old male comes in for a few day history of low back pain with radiation to legs.  States has history of lumbar pain/problems, controlled well on gabapentin.  States recently picked up a heavy box and turned his back, and now with bilateral lower back pain that intermittently radiates down the dorsal aspect of the thighs.  Denies injury/trauma.  Denies saddle anesthesia, loss of bladder or bowel control.  Denies fever, chills, night sweats.  States has been taking gabapentin every 3 days when back problems were controlled, now going back to  1 dose every day, and has not improved symptoms.  Standing makes the pain better, sitting and movement makes the pain worse.     History reviewed. No pertinent past medical history.  There are no active problems to display for this patient.   History reviewed. No pertinent surgical history.     Home Medications    Prior to Admission medications   Medication Sig Start Date End Date Taking? Authorizing Provider  cyclobenzaprine (FLEXERIL) 5 MG tablet Take 1 tablet (5 mg total) by mouth 2 (two) times daily as needed for muscle spasms. 09/22/17   Cathie HoopsYu, Vita Currin V, PA-C  gabapentin (NEURONTIN) 300 MG capsule TAKE 1 CAPSULE(300 MG) BY MOUTH AT BEDTIME 08/13/17   Tyrell AntonioNewton, Frederic, MD  meloxicam (MOBIC) 15 MG tablet Take 1 tablet (15 mg total) by mouth daily. 09/22/17   Belinda FisherYu, Vahan Wadsworth V, PA-C    Family History Family History  Problem Relation Age of Onset  . Cancer Mother        colon cancer  . Arthritis Mother     Social History Social History   Tobacco Use  . Smoking status: Never Smoker  . Smokeless tobacco: Never Used  Substance Use Topics  . Alcohol use: Not on file  . Drug use: Not on file     Allergies   Patient has no known  allergies.   Review of Systems Review of Systems  Reason unable to perform ROS: See HPI as above.     Physical Exam Triage Vital Signs ED Triage Vitals [09/22/17 1518]  Enc Vitals Group     BP (!) 115/99     Pulse Rate 92     Resp 18     Temp 98.1 F (36.7 C)     Temp Source Oral     SpO2 100 %     Weight      Height      Head Circumference      Peak Flow      Pain Score 7     Pain Loc      Pain Edu?      Excl. in GC?    No data found.  Updated Vital Signs BP (!) 115/99 (BP Location: Left Arm)   Pulse 92   Temp 98.1 F (36.7 C) (Oral)   Resp 18   SpO2 100%   Physical Exam  Constitutional: He is oriented to person, place, and time. He appears well-developed and well-nourished. No distress.  HENT:  Head: Normocephalic and atraumatic.  Eyes: Pupils are equal, round, and reactive to light. Conjunctivae are normal.  Cardiovascular: Normal rate, regular rhythm and normal heart sounds. Exam reveals no gallop  and no friction rub.  No murmur heard. Pulmonary/Chest: Effort normal and breath sounds normal. No accessory muscle usage or stridor. No respiratory distress. He has no decreased breath sounds. He has no wheezes. He has no rhonchi. He has no rales.  Musculoskeletal:  No rashes seen.  No tenderness to palpation of the spinous processes.  Mild tenderness to palpation of right lower back.  Decreased range of motion of back and hips due to pain.  Strength deferred due to pain. Sensation intact and equal bilaterally.  Neurological: He is alert and oriented to person, place, and time.  Skin: Skin is warm and dry. He is not diaphoretic.     UC Treatments / Results  Labs (all labs ordered are listed, but only abnormal results are displayed) Labs Reviewed - No data to display  EKG None  Radiology No results found.  Procedures Procedures (including critical care time)  Medications Ordered in UC Medications  ketorolac (TORADOL) 30 MG/ML injection 30 mg (30 mg  Intramuscular Given 09/22/17 1600)    Initial Impression / Assessment and Plan / UC Course  I have reviewed the triage vital signs and the nursing notes.  Pertinent labs & imaging results that were available during my care of the patient were reviewed by me and considered in my medical decision making (see chart for details).    Toradol injection in office today. Start NSAID as directed for pain and inflammation. Muscle relaxant as needed. Ice/heat compresses. Discussed with patient strain can take up to 3-4 weeks to resolve, but should be getting better each week. Return precautions given.   Final Clinical Impressions(s) / UC Diagnoses   Final diagnoses:  Acute bilateral low back pain without sciatica    ED Prescriptions    Medication Sig Dispense Auth. Provider   meloxicam (MOBIC) 15 MG tablet Take 1 tablet (15 mg total) by mouth daily. 15 tablet Jane Birkel V, PA-C   cyclobenzaprine (FLEXERIL) 5 MG tablet Take 1 tablet (5 mg total) by mouth 2 (two) times daily as needed for muscle spasms. 20 tablet Threasa Alpha, [place] 09/22/17 1924

## 2017-09-22 NOTE — ED Triage Notes (Signed)
Pt here with lower back pain with some radiation to legs

## 2017-10-16 ENCOUNTER — Ambulatory Visit (INDEPENDENT_AMBULATORY_CARE_PROVIDER_SITE_OTHER): Payer: Managed Care, Other (non HMO) | Admitting: Physical Medicine and Rehabilitation

## 2018-02-05 ENCOUNTER — Other Ambulatory Visit (INDEPENDENT_AMBULATORY_CARE_PROVIDER_SITE_OTHER): Payer: Self-pay | Admitting: Physical Medicine and Rehabilitation

## 2018-02-05 NOTE — Telephone Encounter (Signed)
Please advise 

## 2019-02-18 ENCOUNTER — Other Ambulatory Visit: Payer: Self-pay

## 2019-02-18 ENCOUNTER — Ambulatory Visit: Payer: Managed Care, Other (non HMO) | Attending: Internal Medicine

## 2019-02-18 DIAGNOSIS — Z20822 Contact with and (suspected) exposure to covid-19: Secondary | ICD-10-CM

## 2019-02-19 ENCOUNTER — Other Ambulatory Visit: Payer: Managed Care, Other (non HMO)

## 2019-02-20 LAB — NOVEL CORONAVIRUS, NAA: SARS-CoV-2, NAA: NOT DETECTED
